# Patient Record
Sex: Male | Born: 1940 | Race: White | Hispanic: No | Smoking: Never smoker
Health system: Southern US, Community
[De-identification: ages and names within clinical notes are randomized; demographics above are authoritative.]

## PROBLEM LIST (undated history)

## (undated) HISTORY — PX: TONSILLECTOMY: SUR1361

## (undated) HISTORY — PX: OTHER SURGICAL HISTORY: SHX169

---

## 2002-03-04 HISTORY — PX: COLONOSCOPY: SHX174

## 2006-05-16 ENCOUNTER — Ambulatory Visit: Payer: Self-pay | Admitting: Unknown Physician Specialty

## 2007-05-09 ENCOUNTER — Ambulatory Visit: Payer: Self-pay | Admitting: Family Medicine

## 2007-05-29 ENCOUNTER — Ambulatory Visit: Payer: Self-pay | Admitting: Internal Medicine

## 2007-12-16 ENCOUNTER — Ambulatory Visit: Payer: Self-pay | Admitting: Internal Medicine

## 2011-07-04 ENCOUNTER — Ambulatory Visit: Payer: Self-pay

## 2011-11-07 ENCOUNTER — Ambulatory Visit: Payer: Self-pay

## 2012-06-11 ENCOUNTER — Ambulatory Visit: Payer: Self-pay | Admitting: Family Medicine

## 2012-06-19 ENCOUNTER — Ambulatory Visit: Payer: Self-pay | Admitting: Family Medicine

## 2014-10-29 ENCOUNTER — Ambulatory Visit: Payer: Medicare Other

## 2014-10-29 ENCOUNTER — Encounter: Payer: Self-pay | Admitting: *Deleted

## 2014-10-29 ENCOUNTER — Ambulatory Visit
Admission: EM | Admit: 2014-10-29 | Discharge: 2014-10-29 | Disposition: A | Discharge status: Home / Self Care | Payer: Medicare Other | Attending: Family Medicine | Admitting: Family Medicine

## 2014-10-29 DIAGNOSIS — J40 Bronchitis, not specified as acute or chronic: Secondary | ICD-10-CM | POA: Diagnosis not present

## 2014-10-29 DIAGNOSIS — J320 Chronic maxillary sinusitis: Secondary | ICD-10-CM

## 2014-10-29 MED ORDER — AZITHROMYCIN 250 MG PO TABS: ORAL_TABLET | ORAL | Status: DC

## 2014-10-29 MED ORDER — FLUTICASONE PROPIONATE 50 MCG/ACT NA SUSP: 2.0000 | Freq: Every day | NASAL | Status: DC

## 2014-10-29 NOTE — Discharge Instructions (Signed)
Take medication as prescribed. Rest. Eat and drink regularly.   Follow up with your primary care physician next week as needed. Return to Urgent Care or go to ER for new or worsening concerns.   Sinusitis Sinusitis is redness, soreness, and inflammation of the paranasal sinuses. Paranasal sinuses are air pockets within the bones of your face (beneath the eyes, the middle of the forehead, or above the eyes). In healthy paranasal sinuses, mucus is able to drain out, and air is able to circulate through them by way of your nose. However, when your paranasal sinuses are inflamed, mucus and air can become trapped. This can allow bacteria and other germs to grow and cause infection. Sinusitis can develop quickly and last only a short time (acute) or continue over a long period (chronic). Sinusitis that lasts for more than 12 weeks is considered chronic.  CAUSES  Causes of sinusitis include:  Allergies.  Structural abnormalities, such as displacement of the cartilage that separates your nostrils (deviated septum), which can decrease the air flow through your nose and sinuses and affect sinus drainage.  Functional abnormalities, such as when the small hairs (cilia) that line your sinuses and help remove mucus do not work properly or are not present. SIGNS AND SYMPTOMS  Symptoms of acute and chronic sinusitis are the same. The primary symptoms are pain and pressure around the affected sinuses. Other symptoms include:  Upper toothache.  Earache.  Headache.  Bad breath.  Decreased sense of smell and taste.  A cough, which worsens when you are lying flat.  Fatigue.  Fever.  Thick drainage from your nose, which often is green and may contain pus (purulent).  Swelling and warmth over the affected sinuses. DIAGNOSIS  Your health care provider will perform a physical exam. During the exam, your health care provider may:  Look in your nose for signs of abnormal growths in your nostrils (nasal  polyps).  Tap over the affected sinus to check for signs of infection.  View the inside of your sinuses (endoscopy) using an imaging device that has a light attached (endoscope). If your health care provider suspects that you have chronic sinusitis, one or more of the following tests may be recommended:  Allergy tests.  Nasal culture. A sample of mucus is taken from your nose, sent to a lab, and screened for bacteria.  Nasal cytology. A sample of mucus is taken from your nose and examined by your health care provider to determine if your sinusitis is related to an allergy. TREATMENT  Most cases of acute sinusitis are related to a viral infection and will resolve on their own within 10 days. Sometimes medicines are prescribed to help relieve symptoms (pain medicine, decongestants, nasal steroid sprays, or saline sprays).  However, for sinusitis related to a bacterial infection, your health care provider will prescribe antibiotic medicines. These are medicines that will help kill the bacteria causing the infection.  Rarely, sinusitis is caused by a fungal infection. In theses cases, your health care provider will prescribe antifungal medicine. For some cases of chronic sinusitis, surgery is needed. Generally, these are cases in which sinusitis recurs more than 3 times per year, despite other treatments. HOME CARE INSTRUCTIONS   Drink plenty of water. Water helps thin the mucus so your sinuses can drain more easily.  Use a humidifier.  Inhale steam 3 to 4 times a day (for example, sit in the bathroom with the shower running).  Apply a warm, moist washcloth to your face 3  to 4 times a day, or as directed by your health care provider.  Use saline nasal sprays to help moisten and clean your sinuses.  Take medicines only as directed by your health care provider.  If you were prescribed either an antibiotic or antifungal medicine, finish it all even if you start to feel better. SEEK IMMEDIATE  MEDICAL CARE IF:  You have increasing pain or severe headaches.  You have nausea, vomiting, or drowsiness.  You have swelling around your face.  You have vision problems.  You have a stiff neck.  You have difficulty breathing. MAKE SURE YOU:   Understand these instructions.  Will watch your condition.  Will get help right away if you are not doing well or get worse. Document Released: 02/18/2005 Document Revised: 07/05/2013 Document Reviewed: 03/05/2011 [facility] Patient Information 2015 [place], [place]. This information is not intended to replace advice given to you by your health care provider. Make sure you discuss any questions you have with your health care provider.

## 2014-10-29 NOTE — ED Notes (Signed)
Sinus congestion and cough with chest congestion x 3 days.  General Fatigue.

## 2014-10-29 NOTE — ED Provider Notes (Signed)
[facility] Provider Note  ____________________________________________  Time seen: Approximately 3:36 PM  I have reviewed the triage vital signs and the nursing notes.   HISTORY  Chief Complaint Facial Pain   HPI Steven Solomon is a 74 y.o. male presents to the complaints of 4-5 days runny nose and congestion. Patient reports that last 2 days he has had increased nasal drainage, sinus pressure and patient reports that also last 2 days he feels that the congestion is draining into his chest and causing him to cough more. Denies wheezes, chest pain, shortness of breath or fever. Reports continues to eat and drink well. Denies weakness or dizziness. Reports he has a history of similar and he was seen by Dr. Irving Shows ENT diagnosed with a sinus infection. Patient states at that time he was using fluticasone nasal spray which helped but does not have any more and requests refill.States sinus pain is 3/10. Denies headache or other complaints.    No past medical history on file.  There are no active problems to display for this patient.   Past Surgical History  Procedure Laterality Date  . Ar throcscopic left knee      Current Outpatient Rx  Name  Route  Sig  Dispense  Refill  .           Marland Kitchen             Allergies Review of patient's allergies indicates no known allergies.  No family history on file.  Social History Social History  Substance Use Topics  . Smoking status: Not on file  . Smokeless tobacco: Not on file  . Alcohol Use: Not on file    Review of Systems Constitutional: No fever/chills Eyes: No visual changes. ENT: Positive for runny nose, cough, congestion and sore throat. Cardiovascular: Denies chest pain. Respiratory: Denies shortness of breath. Denies wheezes. Gastrointestinal: No abdominal pain.  No nausea, no vomiting.  No diarrhea.  No constipation. Genitourinary: Negative for dysuria. Musculoskeletal:  Negative for back pain. Skin: Negative for rash. Neurological: Negative for headaches, focal weakness or numbness.  10-point ROS otherwise negative.  ____________________________________________   PHYSICAL EXAM:  VITAL SIGNS: ED Triage Vitals  Enc Vitals Group     BP 10/29/14 1454 169/98 mmHg     Pulse Rate 10/29/14 1454 80     Resp 10/29/14 1454 16     Temp 10/29/14 1454 98 F (36.7 C)     Temp Source 10/29/14 1454 Oral     SpO2 10/29/14 1454 98 %     Weight 10/29/14 1454 190 lb (86.183 kg)     Height 10/29/14 1454  (1.702 m)     Head Cir --      Peak Flow --      Pain Score --      Pain Loc --      Pain Edu? --      Excl. in GC? --   Blood pressure 155/88, pulse 79, temperature 98 F (36.7 C), temperature source Oral, resp. rate 18, height  (1.702 m), weight 190 lb (86.183 kg), SpO2 78 %.   Constitutional: Alert and oriented. Well appearing and in no acute distress. Eyes: Conjunctivae are normal. PERRL. EOMI. Head: [email or web address] to mod maxillary sinus TTP, mild frontal sinus TTP. No erythema or swelling.   Ears: no erythema, normal TMs bilaterally.   Nose: mild purulent nasal drainage.   Mouth/Throat: Mucous membranes are moist.  Oropharynx non-erythematous.No tonsillar swelling.  Neck: No stridor.  No cervical spine tenderness to palpation. Hematological/Lymphatic/Immunilogical: No cervical lymphadenopathy. Cardiovascular: Normal rate, regular rhythm. Grossly normal heart sounds.  Good peripheral circulation. Respiratory: Normal respiratory effort.  No retractions. No wheezes or rales. Mild scattered rhonchi. Gastrointestinal: Soft and nontender. No distention. Normal Bowel sounds.  No abdominal bruits. No CVA tenderness. Musculoskeletal: No lower or upper extremity tenderness nor edema.  No joint effusions. Bilateral pedal pulses equal and easily palpated.  Neurologic:  Normal speech and language. No gross focal neurologic deficits are appreciated. No gait  instability. Skin:  Skin is warm, dry and intact. No rash noted. Psychiatric: Mood and affect are normal. Speech and behavior are normal.  ____________________________________________   LABS (all labs ordered are listed, but only abnormal results are displayed)  Labs Reviewed - No data to display  RADIOLOGY  EXAM: CHEST 2 VIEW  COMPARISON: 06/11/2012  FINDINGS: Mild hyperinflation. Midline trachea. Normal heart size and mediastinal contours. No pleural effusion or pneumothorax. Clear lungs.  IMPRESSION: No acute cardiopulmonary disease.   Electronically Signed By: Jeronimo Greaves M.D. On: 10/29/2014 15:40  I, Renford Dills, personally viewed and evaluated these images (plain radiographs) as part of my medical decision making.   ____________________________________________   INITIAL IMPRESSION / ASSESSMENT AND PLAN / ED COURSE  Pertinent labs & imaging results that were available during my care of the patient were reviewed by me and considered in my medical decision making (see chart for details).  Very well appearing, no acute distress. Presents for 4 days runny nose, congestion, sinus pressure and cough. Denies weakness, dizziness, chest pain, shortness of breath. Continues to eat and drink well. Chest xray no acute cardiopulmonary disease. Will treat with z-pack and fluticasone. Discussed strict follow up and return parameters. Patient verbalized understanding and agreed to plan.  ____________________________________________   FINAL CLINICAL IMPRESSION(S) / ED DIAGNOSES  Final diagnoses:  Maxillary sinusitis, unspecified chronicity  Bronchitis       Renford Dills, NP 10/29/14 1651

## 2014-11-30 ENCOUNTER — Encounter: Payer: Self-pay | Admitting: Internal Medicine

## 2014-12-01 ENCOUNTER — Ambulatory Visit
Admission: RE | Admit: 2014-12-01 | Discharge: 2014-12-01 | Disposition: A | Discharge status: Home / Self Care | Payer: Medicare Other | Source: Ambulatory Visit | Attending: Internal Medicine | Admitting: Internal Medicine

## 2014-12-01 ENCOUNTER — Ambulatory Visit (INDEPENDENT_AMBULATORY_CARE_PROVIDER_SITE_OTHER): Payer: Medicare Other | Admitting: Internal Medicine

## 2014-12-01 ENCOUNTER — Encounter: Payer: Self-pay | Admitting: Internal Medicine

## 2014-12-01 VITALS — BP 164/90 | HR 80 | Ht 68.0 in | Wt 204.4 lb

## 2014-12-01 DIAGNOSIS — S336XXA Sprain of sacroiliac joint, initial encounter: Secondary | ICD-10-CM | POA: Insufficient documentation

## 2014-12-01 DIAGNOSIS — X58XXXA Exposure to other specified factors, initial encounter: Secondary | ICD-10-CM | POA: Insufficient documentation

## 2014-12-01 MED ORDER — METHOCARBAMOL 500 MG PO TABS: 500.0000 mg | ORAL_TABLET | Freq: Four times a day (QID) | ORAL | Status: DC

## 2014-12-01 NOTE — Progress Notes (Signed)
Date:  12/01/2014   Name:  Steven Solomon   DOB:  April 08, 1940   MRN:  [id number]   Chief Complaint: Hip Pain Hip Pain  The incident occurred 5 to 7 days ago. There was no injury mechanism. The pain is present in the right hip. The quality of the pain is described as aching and shooting. The pain is moderate. The pain has been fluctuating since onset. The symptoms are aggravated by weight bearing. He has tried acetaminophen and ice for the symptoms.   He has pain in his right sacroiliac region radiating down to the lower buttock and into the lateral hip. The pain began after he had a written in the car for several hours and then got out to walk his dog. It's been persistent despite the use of Aleve and ice. Has any leg weakness or numbness. He does walk with a limp because each step causes lower back discomfort. He is no history of previous back injuries. No loss of bowel or bladder control.   Review of Systems:  Review of Systems  Constitutional: Positive for activity change. Negative for fever and fatigue.  Respiratory: Negative for shortness of breath.   Cardiovascular: Negative for chest pain.  Gastrointestinal: Negative for abdominal pain.  Musculoskeletal: Positive for back pain and gait problem. Negative for joint swelling.  Skin: Negative for color change and rash.    There are no active problems to display for this patient.   Prior to Admission medications   Not on File    No Known Allergies  Past Surgical History  Procedure Laterality Date  . Ar throcscopic left knee    . Tonsillectomy    . Colonoscopy  2004    Social History  Substance Use Topics  . Smoking status: Never Smoker   . Smokeless tobacco: None  . Alcohol Use: 2.4 oz/week    4 Standard drinks or equivalent per week     Medication list has been reviewed and updated.  Physical Examination:  Physical Exam  Constitutional: He is oriented to person, place, and time. He appears well-developed and  well-nourished.  Cardiovascular: Normal rate, regular rhythm and normal heart sounds.   Pulmonary/Chest: Effort normal and breath sounds normal.  Musculoskeletal: He exhibits no edema.       Lumbar back: He exhibits tenderness. He exhibits no spasm.  Negative straight leg raise bilaterally Normal range of motion of both hips Mild tenderness over the right SI region. Minimal lateral hip bursal tenderness on the right  Neurological: He is alert and oriented to person, place, and time. He has normal reflexes.  Nursing note and vitals reviewed.   BP 164/90 mmHg  Pulse 80  Ht  (1.727 m)  Wt 204 lb 6.4 oz (92.715 kg)  BMI 31.09 kg/m2  Assessment and Plan: 1. Sacroiliac (ligament) sprain, initial encounter Patient to continue ice as well as Aleve twice a day Add Robaxin 500 mg 3 times a day to 4 times a day Follow-up or will refer in one week if not improved - DG Lumbar Spine Complete; Future   Bari Edward, MD [facility]  12/01/2014

## 2014-12-05 ENCOUNTER — Telehealth: Payer: Self-pay

## 2014-12-05 ENCOUNTER — Other Ambulatory Visit: Payer: Self-pay | Admitting: Internal Medicine

## 2014-12-05 DIAGNOSIS — S29019A Strain of muscle and tendon of unspecified wall of thorax, initial encounter: Secondary | ICD-10-CM | POA: Insufficient documentation

## 2014-12-05 DIAGNOSIS — S39013A Strain of muscle, fascia and tendon of pelvis, initial encounter: Secondary | ICD-10-CM

## 2014-12-05 NOTE — Telephone Encounter (Signed)
Patient wants referral to orthopedics for hip pain.

## 2014-12-08 ENCOUNTER — Ambulatory Visit: Payer: Self-pay | Admitting: Internal Medicine

## 2014-12-19 ENCOUNTER — Other Ambulatory Visit: Payer: Self-pay | Admitting: Orthopedic Surgery

## 2014-12-19 DIAGNOSIS — M5116 Intervertebral disc disorders with radiculopathy, lumbar region: Secondary | ICD-10-CM

## 2014-12-23 ENCOUNTER — Ambulatory Visit: Payer: Medicare Other

## 2015-01-06 ENCOUNTER — Ambulatory Visit
Admission: RE | Admit: 2015-01-06 | Discharge: 2015-01-06 | Disposition: A | Discharge status: Home / Self Care | Payer: Medicare Other | Source: Ambulatory Visit | Attending: Orthopedic Surgery | Admitting: Orthopedic Surgery

## 2015-01-06 DIAGNOSIS — M5116 Intervertebral disc disorders with radiculopathy, lumbar region: Secondary | ICD-10-CM | POA: Diagnosis present

## 2015-01-17 ENCOUNTER — Other Ambulatory Visit (HOSPITAL_COMMUNITY): Payer: Self-pay | Admitting: Urology

## 2015-01-17 ENCOUNTER — Other Ambulatory Visit: Payer: Self-pay | Admitting: Urology

## 2015-01-17 DIAGNOSIS — N281 Cyst of kidney, acquired: Secondary | ICD-10-CM

## 2015-01-20 ENCOUNTER — Ambulatory Visit
Admission: RE | Admit: 2015-01-20 | Discharge: 2015-01-20 | Disposition: A | Discharge status: Home / Self Care | Payer: Medicare Other | Source: Ambulatory Visit | Attending: Urology | Admitting: Urology

## 2015-01-20 DIAGNOSIS — N281 Cyst of kidney, acquired: Secondary | ICD-10-CM | POA: Insufficient documentation

## 2015-08-18 ENCOUNTER — Ambulatory Visit (INDEPENDENT_AMBULATORY_CARE_PROVIDER_SITE_OTHER): Payer: Medicare Other | Admitting: Internal Medicine

## 2015-08-18 ENCOUNTER — Telehealth: Payer: Self-pay

## 2015-08-18 ENCOUNTER — Encounter: Payer: Self-pay | Admitting: Internal Medicine

## 2015-08-18 VITALS — BP 136/86 | HR 73 | Resp 16 | Ht 68.0 in | Wt 202.0 lb

## 2015-08-18 DIAGNOSIS — R799 Abnormal finding of blood chemistry, unspecified: Secondary | ICD-10-CM | POA: Diagnosis not present

## 2015-08-18 DIAGNOSIS — R7989 Other specified abnormal findings of blood chemistry: Secondary | ICD-10-CM

## 2015-08-18 NOTE — Telephone Encounter (Signed)
Patient called and stated he would like you to call him back as soon as possible at your earliest convenience.

## 2015-08-18 NOTE — Progress Notes (Signed)
Date:  08/18/2015   Name:  Steven Solomon   DOB:  1940-04-22   MRN:  [id number]   Chief Complaint: Heart Problem Insurance Rep used to be Tech in ER and said he has lab values that are consistant with Heart Failure and advised him to HURRY and be seem as this is urgent health matter.  He reports a BNP of 326. The patient reports no symptoms of congestive heart failure. He exercises 4-5 times a week swimming lifting weights and walking the dog. His stamina is good and he does not have any shortness of breath dizziness or lower extremity edema. He denies orthopnea or PND. He tends to have slightly elevated diastolic blood pressures in the doctor's office but at home these are normal. Remainder of his blood work done for his insurance exam was completely normal by report.   Review of Systems  Constitutional: Negative for chills, fatigue and unexpected weight change.  Respiratory: Negative for cough, choking, chest tightness, shortness of breath and wheezing.   Cardiovascular: Negative for chest pain, palpitations and leg swelling.  Gastrointestinal: Negative for abdominal pain.  Genitourinary: Negative for dysuria.  Musculoskeletal: Negative for joint swelling and arthralgias.  Skin: Negative for rash.  Neurological: Negative for dizziness, tremors, syncope, weakness, numbness and headaches.  Psychiatric/Behavioral: Negative for dysphoric mood and agitation. The patient is not nervous/anxious.     Patient Active Problem List   Diagnosis Date Noted  . Strain of sacroiliac region 12/05/2014    Prior to Admission medications   Medication Sig Start Date End Date Taking? Authorizing Provider  ibuprofen (GOODSENSE IBUPROFEN) 200 MG tablet Take by mouth.   Yes Historical Provider, MD  methocarbamol (ROBAXIN) 500 MG tablet Take 1 tablet (500 mg total) by mouth 4 (four) times daily. 12/01/14  Yes Reubin MilanLaura H Ewa Hipp, MD  predniSONE (DELTASONE) 10 MG tablet  12/16/14  Yes Historical Provider, MD     No Known Allergies  Past Surgical History  Procedure Laterality Date  . Ar throcscopic left knee    . Tonsillectomy    . Colonoscopy  2004    Social History  Substance Use Topics  . Smoking status: Never Smoker   . Smokeless tobacco: None  . Alcohol Use: 2.4 oz/week    4 Standard drinks or equivalent per week     Medication list has been reviewed and updated.   Physical Exam  Constitutional: He is oriented to person, place, and time. He appears well-developed. No distress.  HENT:  Head: Normocephalic and atraumatic.  Neck: Normal range of motion. Neck supple. No JVD present. Carotid bruit is not present. No thyromegaly present.  Cardiovascular: Normal rate, regular rhythm, normal heart sounds and intact distal pulses.   No extrasystoles are present. Exam reveals no gallop.   No murmur heard. Pulmonary/Chest: Effort normal and breath sounds normal. No respiratory distress. He has no wheezes. He has no rales.  Abdominal: Soft. Bowel sounds are normal. There is no tenderness.  Musculoskeletal: Normal range of motion. He exhibits no edema or tenderness.  Lymphadenopathy:    He has no cervical adenopathy.  Neurological: He is alert and oriented to person, place, and time.  Skin: Skin is warm and dry. No rash noted.  Psychiatric: He has a normal mood and affect. His behavior is normal. Thought content normal.  Nursing note and vitals reviewed.   BP 140/82 mmHg  Pulse 73  Resp 16  Ht 5\' 8"  (1.727 m)  Wt 202 lb (91.627 kg)  BMI 30.72 kg/m2  SpO2 97%  Assessment and Plan: 1. Elevated brain natriuretic peptide (BNP) level Will repeat BNP and fax to insurance carrier If still elevated, will refer to Dr. Gwen Pounds for further evaluation - Pro b natriuretic peptide   Bari Edward, MD [facility]  08/18/2015

## 2015-08-18 NOTE — Patient Instructions (Signed)
DASH Eating Plan  DASH stands for "Dietary Approaches to Stop Hypertension." The DASH eating plan is a healthy eating plan that has been shown to reduce high blood pressure (hypertension). Additional health benefits may include reducing the risk of type 2 diabetes mellitus, heart disease, and stroke. The DASH eating plan may also help with weight loss.  WHAT DO I NEED TO KNOW ABOUT THE DASH EATING PLAN?  For the DASH eating plan, you will follow these general guidelines:  · Choose foods with a percent daily value for sodium of less than 5% (as listed on the food label).  · Use salt-free seasonings or herbs instead of table salt or sea salt.  · Check with your health care provider or pharmacist before using salt substitutes.  · Eat lower-sodium products, often labeled as "lower sodium" or "no salt added."  · Eat fresh foods.  · Eat more vegetables, fruits, and low-fat dairy products.  · Choose whole grains. Look for the word "whole" as the first word in the ingredient list.  · Choose fish and skinless chicken or turkey more often than red meat. Limit fish, poultry, and meat to 6 oz (170 g) each day.  · Limit sweets, desserts, sugars, and sugary drinks.  · Choose heart-healthy fats.  · Limit cheese to 1 oz (28 g) per day.  · Eat more home-cooked food and less restaurant, buffet, and fast food.  · Limit fried foods.  · Cook foods using methods other than frying.  · Limit canned vegetables. If you do use them, rinse them well to decrease the sodium.  · When eating at a restaurant, ask that your food be prepared with less salt, or no salt if possible.  WHAT FOODS CAN I EAT?  Seek help from a dietitian for individual calorie needs.  Grains  Whole grain or whole wheat bread. Brown rice. Whole grain or whole wheat pasta. Quinoa, bulgur, and whole grain cereals. Low-sodium cereals. Corn or whole wheat flour tortillas. Whole grain cornbread. Whole grain crackers. Low-sodium crackers.  Vegetables  Fresh or frozen vegetables  (raw, steamed, roasted, or grilled). Low-sodium or reduced-sodium tomato and vegetable juices. Low-sodium or reduced-sodium tomato sauce and paste. Low-sodium or reduced-sodium canned vegetables.   Fruits  All fresh, canned (in natural juice), or frozen fruits.  Meat and Other Protein Products  Ground beef (85% or leaner), grass-fed beef, or beef trimmed of fat. Skinless chicken or turkey. Ground chicken or turkey. Pork trimmed of fat. All fish and seafood. Eggs. Dried beans, peas, or lentils. Unsalted nuts and seeds. Unsalted canned beans.  Dairy  Low-fat dairy products, such as skim or 1% milk, 2% or reduced-fat cheeses, low-fat ricotta or cottage cheese, or plain low-fat yogurt. Low-sodium or reduced-sodium cheeses.  Fats and Oils  Tub margarines without trans fats. Light or reduced-fat mayonnaise and salad dressings (reduced sodium). Avocado. Safflower, olive, or canola oils. Natural peanut or almond butter.  Other  Unsalted popcorn and pretzels.  The items listed above may not be a complete list of recommended foods or beverages. Contact your dietitian for more options.  WHAT FOODS ARE NOT RECOMMENDED?  Grains  White bread. White pasta. White rice. Refined cornbread. Bagels and croissants. Crackers that contain trans fat.  Vegetables  Creamed or fried vegetables. Vegetables in a cheese sauce. Regular canned vegetables. Regular canned tomato sauce and paste. Regular tomato and vegetable juices.  Fruits  Dried fruits. Canned fruit in light or heavy syrup. Fruit juice.  Meat and Other Protein   Products  Fatty cuts of meat. Ribs, chicken wings, bacon, sausage, bologna, salami, chitterlings, fatback, hot dogs, bratwurst, and packaged luncheon meats. Salted nuts and seeds. Canned beans with salt.  Dairy  Whole or 2% milk, cream, half-and-half, and cream cheese. Whole-fat or sweetened yogurt. Full-fat cheeses or blue cheese. Nondairy creamers and whipped toppings. Processed cheese, cheese spreads, or cheese  curds.  Condiments  Onion and garlic salt, seasoned salt, table salt, and sea salt. Canned and packaged gravies. Worcestershire sauce. Tartar sauce. Barbecue sauce. Teriyaki sauce. Soy sauce, including reduced sodium. Steak sauce. Fish sauce. Oyster sauce. Cocktail sauce. Horseradish. Ketchup and mustard. Meat flavorings and tenderizers. Bouillon cubes. Hot sauce. Tabasco sauce. Marinades. Taco seasonings. Relishes.  Fats and Oils  Butter, stick margarine, lard, shortening, ghee, and bacon fat. Coconut, palm kernel, or palm oils. Regular salad dressings.  Other  Pickles and olives. Salted popcorn and pretzels.  The items listed above may not be a complete list of foods and beverages to avoid. Contact your dietitian for more information.  WHERE CAN I FIND MORE INFORMATION?  National Heart, Lung, and Blood Institute: www.nhlbi.nih.gov/health/health-topics/topics/dash/     This information is not intended to replace advice given to you by your health care provider. Make sure you discuss any questions you have with your health care provider.     Document Released: 02/07/2011 Document Revised: 03/11/2014 Document Reviewed: 12/23/2012  Elsevier Interactive Patient Education ©2016 [employer]

## 2015-08-19 LAB — PRO B NATRIURETIC PEPTIDE: NT-Pro BNP: 434 pg/mL — ABNORMAL HIGH (ref 0–376)

## 2015-08-21 NOTE — Telephone Encounter (Signed)
completed

## 2015-08-22 ENCOUNTER — Telehealth: Payer: Self-pay

## 2015-08-22 NOTE — Telephone Encounter (Signed)
[facility]. Please make sure all referrals for Medicare, Tricare and [employer]BS. Please fax notes also patient scheduled appt on his own. NEED REFERR[facility]XED. [facility]

## 2017-06-03 ENCOUNTER — Telehealth: Payer: Self-pay | Admitting: Internal Medicine

## 2017-06-03 ENCOUNTER — Ambulatory Visit (INDEPENDENT_AMBULATORY_CARE_PROVIDER_SITE_OTHER): Payer: Medicare Other | Admitting: Internal Medicine

## 2017-06-03 ENCOUNTER — Encounter: Payer: Self-pay | Admitting: Internal Medicine

## 2017-06-03 VITALS — BP 138/86 | HR 65 | Ht 68.0 in | Wt 191.0 lb

## 2017-06-03 DIAGNOSIS — F321 Major depressive disorder, single episode, moderate: Secondary | ICD-10-CM | POA: Diagnosis not present

## 2017-06-03 MED ORDER — ESCITALOPRAM OXALATE 10 MG PO TABS: 10.0000 mg | ORAL_TABLET | Freq: Every day | ORAL | 2 refills | Status: AC

## 2017-06-03 NOTE — Telephone Encounter (Signed)
Wants to discuss a personal issue with you

## 2017-06-03 NOTE — Progress Notes (Signed)
    Date:  06/03/2017   Name:  Steven Solomon   DOB:  November 07, 1940   MRN:  [id number]   Chief Complaint: No chief complaint on file. Depression         This is a new problem.  The current episode started more than 1 month ago.   The onset quality is gradual.   The problem occurs daily.  The problem has been gradually worsening since onset.  Associated symptoms include fatigue and appetite change.  Associated symptoms include no suicidal ideas.     The symptoms are aggravated by family issues and work stress.  Past treatments include nothing.   Review of Systems  Constitutional: Positive for appetite change, fatigue and unexpected weight change. Negative for chills and fever.  Respiratory: Negative for chest tightness, shortness of breath and wheezing.   Cardiovascular: Negative for chest pain and palpitations.  Psychiatric/Behavioral: Positive for depression, dysphoric mood and sleep disturbance. Negative for suicidal ideas. The patient is not nervous/anxious.     Patient Active Problem List   Diagnosis Date Noted  . Strain of sacroiliac region 12/05/2014    Prior to Admission medications   Medication Sig Start Date End Date Taking? Authorizing Provider  ibuprofen (GOODSENSE IBUPROFEN) 200 MG tablet Take by mouth.   Yes [provider]    No Known Allergies  Past Surgical History:  Procedure Laterality Date  . ar throcscopic left knee    . COLONOSCOPY  2004  . TONSILLECTOMY      Social History   Tobacco Use  . Smoking status: Never Smoker  . Smokeless tobacco: Never Used  Substance Use Topics  . Alcohol use: Yes    Alcohol/week: 2.4 oz    Types: 4 Standard drinks or equivalent per week  . Drug use: No     Medication list has been reviewed and updated.  PHQ 2/9 Scores 06/03/2017 08/18/2015 12/01/2014  PHQ - 2 Score 3 0 0  PHQ- 9 Score 13 - -    Physical Exam  Constitutional: He is oriented to person, place, and time. He appears well-developed and  well-nourished.  Neck: Normal range of motion. No thyromegaly present.  Cardiovascular: Normal rate, regular rhythm and normal heart sounds.  Pulmonary/Chest: Effort normal and breath sounds normal. No respiratory distress. He has no wheezes.  Musculoskeletal: He exhibits no edema.  Neurological: He is alert and oriented to person, place, and time.  Skin: Skin is warm.  Psychiatric: He has a normal mood and affect.    BP 138/86   Pulse 65   Ht 5\' 8"  (1.727 m)   Wt 191 lb (86.6 kg)   SpO2 99%   BMI 29.04 kg/m   Assessment and Plan: 1. Current moderate episode of major depressive disorder without prior episode (HCC) Begin medication Follow up in one month   Meds ordered this encounter  Medications  . escitalopram (LEXAPRO) 10 MG tablet    Sig: Take 1 tablet (10 mg total) by mouth daily.    Dispense:  30 tablet    Refill:  2    Partially dictated using Animal nutritionistDragon software. Any errors are unintentional.  Bari EdwardLaura Khamil Lamica, MD [facility]  06/03/2017

## 2017-06-03 NOTE — Telephone Encounter (Signed)
I put patient on for 1130 slot. Thank you.

## 2017-06-10 ENCOUNTER — Ambulatory Visit (INDEPENDENT_AMBULATORY_CARE_PROVIDER_SITE_OTHER): Payer: BC Managed Care – PPO | Admitting: Internal Medicine

## 2017-06-10 ENCOUNTER — Encounter: Payer: Self-pay | Admitting: Internal Medicine

## 2017-06-10 VITALS — BP 132/86 | HR 67 | Ht 68.0 in | Wt 191.0 lb

## 2017-06-10 DIAGNOSIS — F39 Unspecified mood [affective] disorder: Secondary | ICD-10-CM | POA: Diagnosis not present

## 2017-06-11 ENCOUNTER — Telehealth: Payer: Self-pay

## 2017-06-11 LAB — CBC WITH DIFFERENTIAL/PLATELET
Basophils Absolute: 0 10*3/uL (ref 0.0–0.2)
Basos: 0 %
EOS (ABSOLUTE): 0.1 10*3/uL (ref 0.0–0.4)
Eos: 1 %
Hematocrit: 43.8 % (ref 37.5–51.0)
Hemoglobin: 14.7 g/dL (ref 13.0–17.7)
Immature Grans (Abs): 0 10*3/uL (ref 0.0–0.1)
Immature Granulocytes: 0 %
Lymphocytes Absolute: 1.4 10*3/uL (ref 0.7–3.1)
Lymphs: 21 %
MCH: 31.8 pg (ref 26.6–33.0)
MCHC: 33.6 g/dL (ref 31.5–35.7)
MCV: 95 fL (ref 79–97)
MONOS ABS: 0.6 10*3/uL (ref 0.1–0.9)
Monocytes: 9 %
NEUTROS ABS: 4.6 10*3/uL (ref 1.4–7.0)
Neutrophils: 69 %
PLATELETS: 231 10*3/uL (ref 150–379)
RBC: 4.62 x10E6/uL (ref 4.14–5.80)
RDW: 13.3 % (ref 12.3–15.4)
WBC: 6.8 10*3/uL (ref 3.4–10.8)

## 2017-06-11 LAB — COMPREHENSIVE METABOLIC PANEL
A/G RATIO: 1.7 (ref 1.2–2.2)
ALT: 18 IU/L (ref 0–44)
AST: 22 IU/L (ref 0–40)
Albumin: 4.4 g/dL (ref 3.5–4.8)
Alkaline Phosphatase: 116 IU/L (ref 39–117)
BILIRUBIN TOTAL: 0.7 mg/dL (ref 0.0–1.2)
BUN/Creatinine Ratio: 16 (ref 10–24)
BUN: 13 mg/dL (ref 8–27)
CHLORIDE: 99 mmol/L (ref 96–106)
CO2: 25 mmol/L (ref 20–29)
Calcium: 9.5 mg/dL (ref 8.6–10.2)
Creatinine, Ser: 0.8 mg/dL (ref 0.76–1.27)
GFR calc non Af Amer: 88 mL/min/{1.73_m2} (ref >59)
GFR, EST AFRICAN AMERICAN: 102 mL/min/{1.73_m2} (ref >59)
Globulin, Total: 2.6 g/dL (ref 1.5–4.5)
Glucose: 102 mg/dL — ABNORMAL HIGH (ref 65–99)
POTASSIUM: 4.2 mmol/L (ref 3.5–5.2)
Sodium: 141 mmol/L (ref 134–144)
Total Protein: 7 g/dL (ref 6.0–8.5)

## 2017-06-11 LAB — TSH: TSH: 1.29 u[IU]/mL (ref 0.450–4.500)
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
## 2017-07-01 IMAGING — CR DG LUMBAR SPINE COMPLETE 4+V
5 series · 5 of 5 positions shown · non-contrast
Comparison: None.

CLINICAL DATA: One week of low back pain with sudden onset while
getting out of this call our; pain is centered in the right hip and
radiates into the right leg.

EXAM:
LUMBAR SPINE - COMPLETE 4+ VIEW

[l-spine ap]
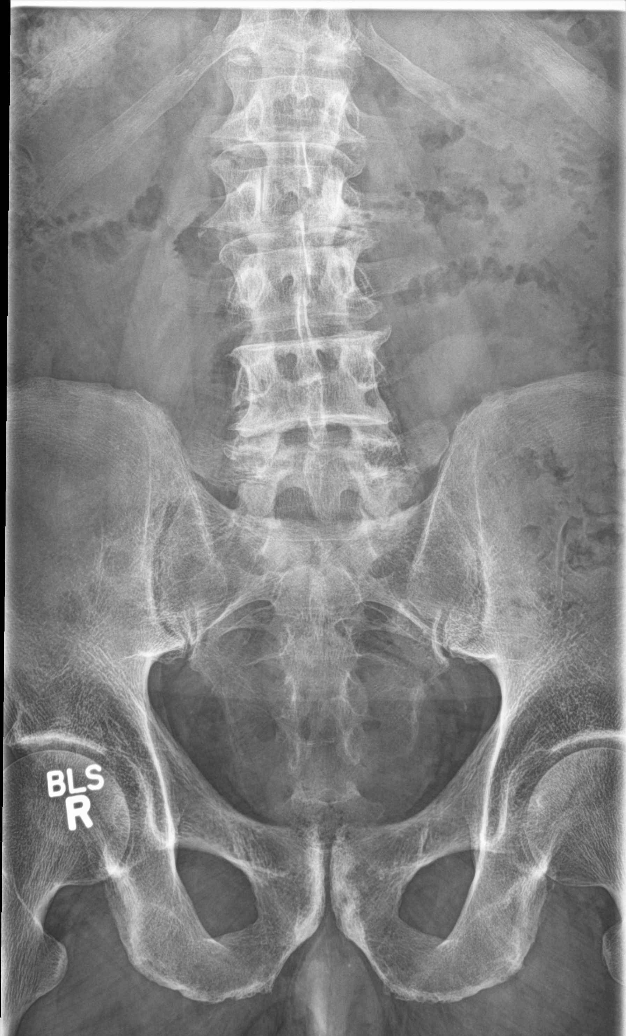

[l-spine obl (1 of 2)]
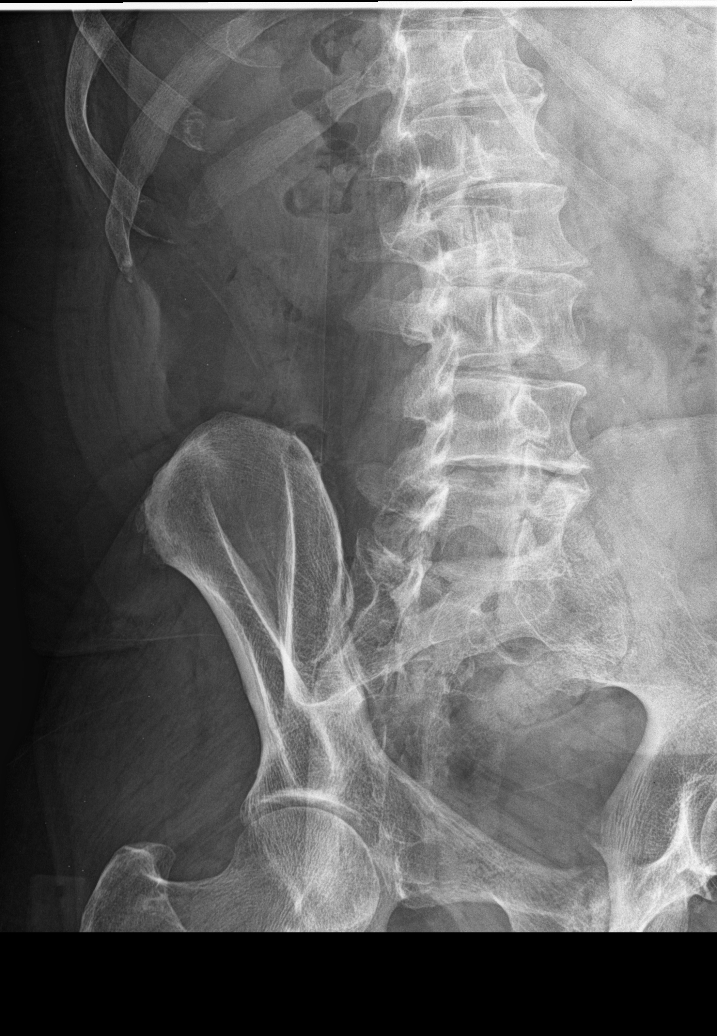

[l-spine obl (2 of 2)]
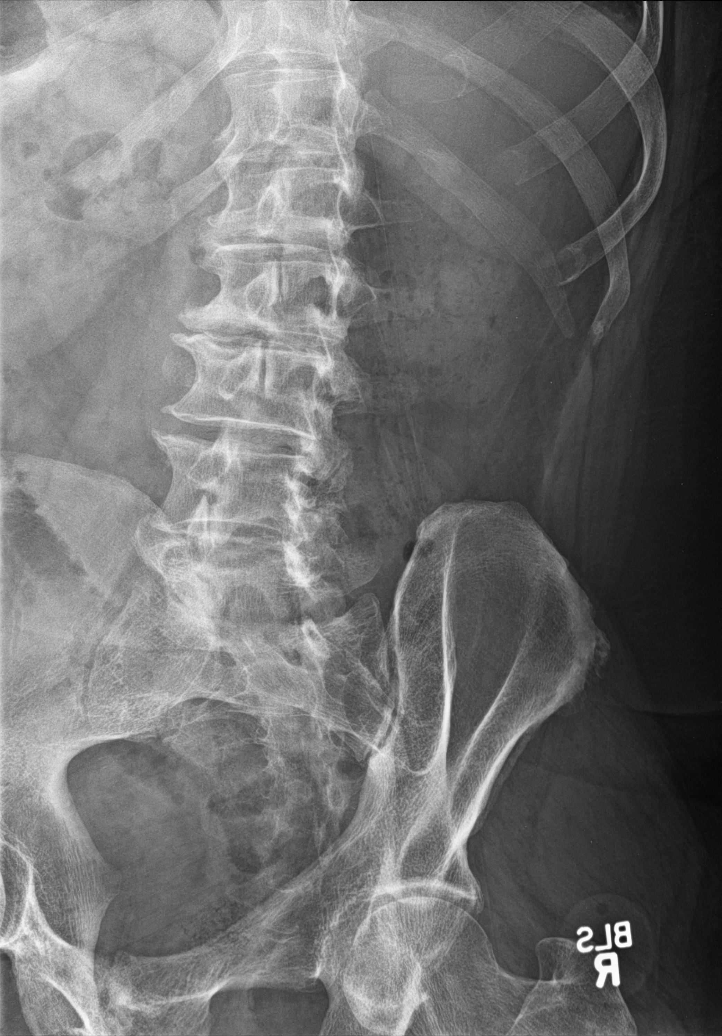

[l-spine lat (1 of 2)]
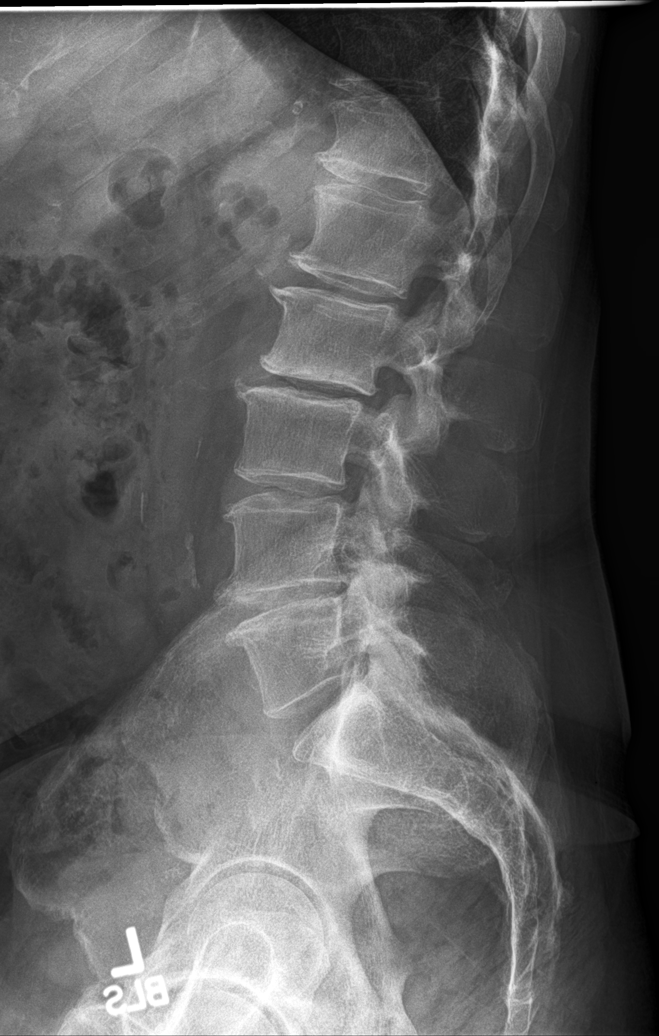

[l-spine lat (2 of 2)]
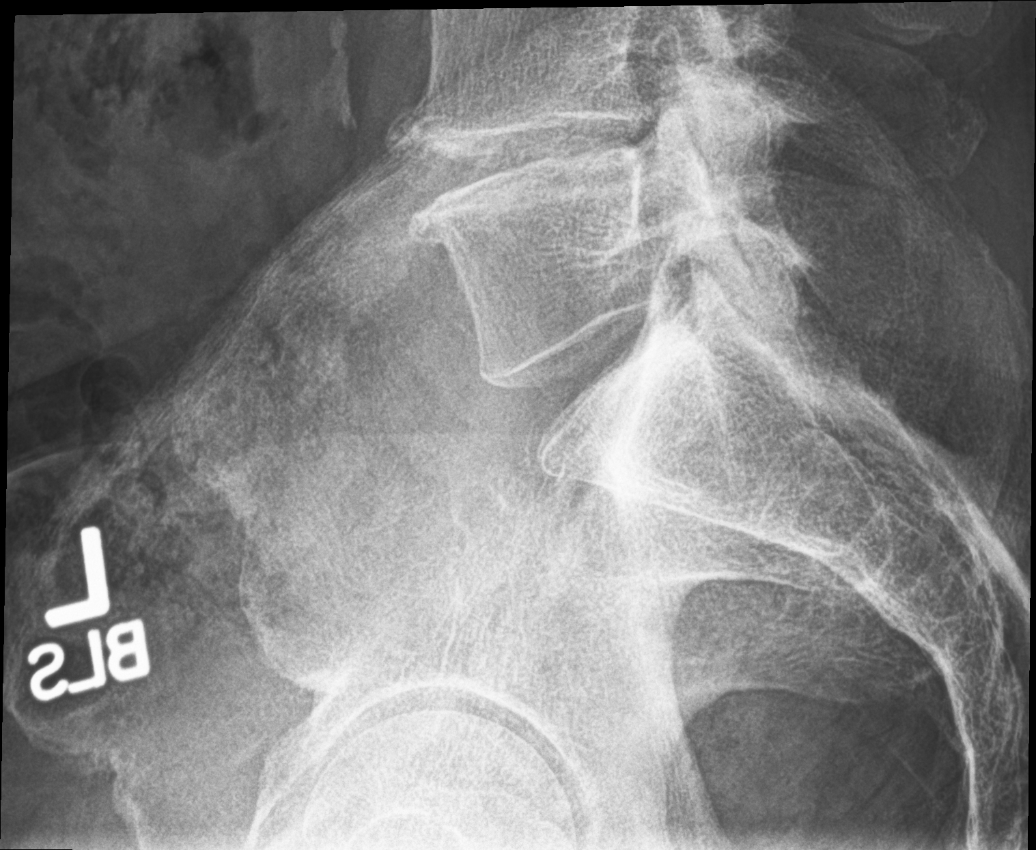

[5 of 5 positions shown; findings below may reference images not displayed]

FINDINGS: The lumbar vertebral bodies are preserved in height. There is
multilevel moderate disc space narrowing with anterior endplate
osteophyte formation. There is no spondylolisthesis. There is facet
joint hypertrophy at L4-5 and at L5-S1. No pars defects are
observed. The pedicles and transverse processes are intact. There is
gentle dextrocurvature centered at L2. The observed portions of the
sacrum are normal.
IMPRESSION: There is moderate multilevel degenerative disc and facet joint
change as described. There is no compression fracture nor other
acute bony abnormality. If the patient's radicular symptoms persist,
MRI would be a useful next imaging step.

## 2017-07-02 NOTE — Progress Notes (Signed)
Date:  09-Jul-2017   Name:  Steven Solomon   DOB:  October 31, 1940   MRN:  [id number]   Chief Complaint: Depression (Wants to discuss medication. ) Depression         This is a new problem.  The current episode started 1 to 4 weeks ago.   The problem occurs constantly.  The problem has been gradually improving since onset.  Associated symptoms include no fatigue, no myalgias and no headaches.  Past treatments include SSRIs - Selective serotonin reuptake inhibitors. He took 10 mg lexapro for several days but he became very lethargic and apathetic.  He quit the meds for several days then started back on 5 mg yesterday. Today he feels fairly well. He is here with his wife today who is concerned about the reaction that he had.  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  No one noticed anything about the presentation - he did not slur his speech or have any focal weakness.  Sx lasted only a brief while.  None since then.  Review of Systems  Constitutional: Negative for chills, fatigue and fever.  Eyes: Negative for visual disturbance.  Respiratory: Negative for chest tightness, shortness of breath and wheezing.   Cardiovascular: Negative for chest pain and palpitations.  Musculoskeletal: Negative for gait problem and myalgias.  Neurological: Negative for dizziness, weakness, numbness and headaches.  Psychiatric/Behavioral: Positive for depression and dysphoric mood. Negative for sleep disturbance. The patient is not nervous/anxious.     Patient Active Problem List   Diagnosis Date Noted  . Strain of sacroiliac region 12/05/2014    Prior to Admission medications   Medication Sig Start Date End Date Taking? Authorizing Provider  escitalopram (LEXAPRO) 10 MG tablet Take 1 tablet (10 mg total) by mouth daily. 06/03/17  Yes Reubin Milan, MD  ibuprofen (GOODSENSE IBUPROFEN) 200 MG tablet Take by mouth.   Yes [provider]    No Known  Allergies  Past Surgical History:  Procedure Laterality Date  . ar throcscopic left knee    . COLONOSCOPY  2004  . TONSILLECTOMY      Social History   Tobacco Use  . Smoking status: Never Smoker  . Smokeless tobacco: Never Used  Substance Use Topics  . Alcohol use: Yes    Alcohol/week: 2.4 oz    Types: 4 Standard drinks or equivalent per week  . Drug use: No     Medication list has been reviewed and updated.  PHQ 2/9 Scores 06/03/2017 08/18/2015 12/01/2014  PHQ - 2 Score 3 0 0  PHQ- 9 Score 13 - -    Physical Exam  Constitutional: He is oriented to person, place, and time. He appears well-developed. No distress.  HENT:  Head: Normocephalic and atraumatic.  Cardiovascular: Normal rate, regular rhythm and normal heart sounds.  Pulmonary/Chest: Effort normal and breath sounds normal. No respiratory distress.  Musculoskeletal: Normal range of motion.  Neurological: He is alert and oriented to person, place, and time.  Skin: Skin is warm and dry. No rash noted.  Psychiatric: He has a normal mood and affect. His behavior is normal. Thought content normal.  Nursing note and vitals reviewed.   BP 132/86   Pulse 67   Ht 5\' 8"  (1.727 m)   Wt 191 lb (86.6 kg)   SpO2 98%   BMI 29.04 kg/m   Assessment and Plan: 1. Mood disorder (HCC) Will get labs to rule out metabolic  issues Continue Lexapro 5 mg daily - CBC with Differential/Platelet - Comprehensive metabolic panel - TSH   No orders of the defined types were placed in this encounter.   Partially dictated using Animal nutritionistDragon software. Any errors are unintentional.  Bari EdwardLaura Jennine Peddy, MD [facility]  06/28/2017

## 2017-07-02 DEATH — deceased

## 2017-07-08 ENCOUNTER — Ambulatory Visit: Payer: BC Managed Care – PPO | Admitting: Internal Medicine
